# Patient Record
Sex: Female | Born: 1955 | Race: White | Hispanic: No | Marital: Married | Smoking: Never smoker
Health system: Southern US, Community
[De-identification: ages and names within clinical notes are randomized; demographics above are authoritative.]

## PROBLEM LIST (undated history)

## (undated) DIAGNOSIS — H8109 Meniere's disease, unspecified ear: Secondary | ICD-10-CM

---

## 1995-05-30 HISTORY — PX: BREAST EXCISIONAL BIOPSY: SUR124

## 2005-05-15 ENCOUNTER — Ambulatory Visit: Payer: Self-pay | Admitting: Family Medicine

## 2006-10-17 ENCOUNTER — Ambulatory Visit: Payer: Self-pay | Admitting: Family Medicine

## 2007-10-01 ENCOUNTER — Ambulatory Visit: Payer: Self-pay | Admitting: Unknown Physician Specialty

## 2011-07-08 ENCOUNTER — Emergency Department: Payer: Self-pay | Admitting: Emergency Medicine

## 2011-07-08 LAB — COMPREHENSIVE METABOLIC PANEL
Albumin: 4.1 g/dL (ref 3.4–5.0)
Anion Gap: 10 (ref 7–16)
Bilirubin,Total: 0.6 mg/dL (ref 0.2–1.0)
Chloride: 106 mmol/L (ref 98–107)
Co2: 28 mmol/L (ref 21–32)
Creatinine: 0.89 mg/dL (ref 0.60–1.30)
EGFR (African American): >60
EGFR (Non-African Amer.): >60
Glucose: 107 mg/dL — ABNORMAL HIGH (ref 65–99)
Osmolality: 288 (ref 275–301)
Potassium: 3.9 mmol/L (ref 3.5–5.1)
SGPT (ALT): 50 U/L

## 2011-07-08 LAB — URINALYSIS, COMPLETE
Blood: NEGATIVE
Glucose,UR: NEGATIVE mg/dL (ref 0–75)
Nitrite: NEGATIVE
Protein: NEGATIVE
Specific Gravity: 1.019 (ref 1.003–1.030)
WBC UR: 4 /HPF (ref 0–5)

## 2011-07-08 LAB — CBC
HGB: 14 g/dL (ref 12.0–16.0)
MCH: 30.4 pg (ref 26.0–34.0)
MCHC: 34.1 g/dL (ref 32.0–36.0)
MCV: 89 fL (ref 80–100)
RBC: 4.61 10*6/uL (ref 3.80–5.20)

## 2011-07-09 LAB — URINE CULTURE

## 2012-11-20 ENCOUNTER — Ambulatory Visit: Payer: Self-pay | Admitting: Family Medicine

## 2012-12-02 ENCOUNTER — Ambulatory Visit: Payer: Self-pay | Admitting: Family Medicine

## 2013-01-03 ENCOUNTER — Ambulatory Visit: Payer: Self-pay | Admitting: Gastroenterology

## 2014-09-30 ENCOUNTER — Other Ambulatory Visit: Payer: Self-pay | Admitting: Family Medicine

## 2014-09-30 DIAGNOSIS — Z1231 Encounter for screening mammogram for malignant neoplasm of breast: Secondary | ICD-10-CM

## 2014-10-20 ENCOUNTER — Ambulatory Visit
Admission: RE | Admit: 2014-10-20 | Discharge: 2014-10-20 | Disposition: A | Discharge status: Home / Self Care | Payer: 59 | Source: Ambulatory Visit | Attending: Family Medicine | Admitting: Family Medicine

## 2014-10-20 DIAGNOSIS — Z1231 Encounter for screening mammogram for malignant neoplasm of breast: Secondary | ICD-10-CM | POA: Insufficient documentation

## 2015-08-30 ENCOUNTER — Emergency Department
Admission: EM | Admit: 2015-08-30 | Discharge: 2015-08-30 | Disposition: A | Discharge status: Home / Self Care | Payer: 59 | Attending: Emergency Medicine | Admitting: Emergency Medicine

## 2015-08-30 ENCOUNTER — Emergency Department: Payer: 59

## 2015-08-30 DIAGNOSIS — R112 Nausea with vomiting, unspecified: Secondary | ICD-10-CM | POA: Diagnosis present

## 2015-08-30 DIAGNOSIS — E876 Hypokalemia: Secondary | ICD-10-CM | POA: Diagnosis not present

## 2015-08-30 DIAGNOSIS — E860 Dehydration: Secondary | ICD-10-CM | POA: Insufficient documentation

## 2015-08-30 DIAGNOSIS — J209 Acute bronchitis, unspecified: Secondary | ICD-10-CM | POA: Insufficient documentation

## 2015-08-30 HISTORY — DX: Meniere's disease, unspecified ear: H81.09

## 2015-08-30 LAB — COMPREHENSIVE METABOLIC PANEL
ALBUMIN: 4.3 g/dL (ref 3.5–5.0)
ALT: 22 U/L (ref 14–54)
ANION GAP: 13 (ref 5–15)
AST: 26 U/L (ref 15–41)
Alkaline Phosphatase: 118 U/L (ref 38–126)
BUN: 17 mg/dL (ref 6–20)
CHLORIDE: 98 mmol/L — AB (ref 101–111)
CO2: 23 mmol/L (ref 22–32)
Calcium: 9.2 mg/dL (ref 8.9–10.3)
Creatinine, Ser: 0.86 mg/dL (ref 0.44–1.00)
GFR calc non Af Amer: >60 mL/min (ref >60)
GLUCOSE: 126 mg/dL — AB (ref 65–99)
POTASSIUM: 2.6 mmol/L — AB (ref 3.5–5.1)
SODIUM: 134 mmol/L — AB (ref 135–145)
Total Bilirubin: 1.1 mg/dL (ref 0.3–1.2)
Total Protein: 7.6 g/dL (ref 6.5–8.1)

## 2015-08-30 LAB — CBC
HEMATOCRIT: 47.4 % — AB (ref 35.0–47.0)
HEMOGLOBIN: 15.9 g/dL (ref 12.0–16.0)
MCH: 29.2 pg (ref 26.0–34.0)
MCHC: 33.6 g/dL (ref 32.0–36.0)
MCV: 86.7 fL (ref 80.0–100.0)
Platelets: 286 10*3/uL (ref 150–440)
RBC: 5.47 MIL/uL — AB (ref 3.80–5.20)
RDW: 13.7 % (ref 11.5–14.5)
WBC: 11.3 10*3/uL — ABNORMAL HIGH (ref 3.6–11.0)

## 2015-08-30 LAB — URINALYSIS COMPLETE WITH MICROSCOPIC (ARMC ONLY)
BACTERIA UA: NONE SEEN
Bilirubin Urine: NEGATIVE
Glucose, UA: NEGATIVE mg/dL
Hgb urine dipstick: NEGATIVE
KETONES UR: NEGATIVE mg/dL
Nitrite: NEGATIVE
PH: 7 (ref 5.0–8.0)
PROTEIN: NEGATIVE mg/dL
RBC / HPF: NONE SEEN RBC/hpf (ref 0–5)
Specific Gravity, Urine: 1.014 (ref 1.005–1.030)

## 2015-08-30 LAB — LIPASE, BLOOD: LIPASE: 23 U/L (ref 11–51)

## 2015-08-30 MED ORDER — ALBUTEROL SULFATE HFA 108 (90 BASE) MCG/ACT IN AERS: 2.0000 | INHALATION_SPRAY | RESPIRATORY_TRACT | Status: AC | PRN

## 2015-08-30 MED ORDER — ONDANSETRON 4 MG PO TBDP: 4.0000 mg | ORAL_TABLET | Freq: Three times a day (TID) | ORAL | Status: AC | PRN

## 2015-08-30 MED ORDER — ONDANSETRON 4 MG PO TBDP
4.0000 mg | ORAL_TABLET | Freq: Once | ORAL | Status: AC | PRN
  Administered 2015-08-30: 4 mg via ORAL
  Filled 2015-08-30: qty 1

## 2015-08-30 MED ORDER — POTASSIUM CHLORIDE CRYS ER 20 MEQ PO TBCR
EXTENDED_RELEASE_TABLET | ORAL | Status: AC
  Filled 2015-08-30: qty 3

## 2015-08-30 MED ORDER — METHYLPREDNISOLONE SODIUM SUCC 125 MG IJ SOLR
125.0000 mg | INTRAMUSCULAR | Status: AC
  Administered 2015-08-30: 125 mg via INTRAVENOUS

## 2015-08-30 MED ORDER — POTASSIUM CHLORIDE CRYS ER 20 MEQ PO TBCR
60.0000 meq | EXTENDED_RELEASE_TABLET | Freq: Once | ORAL | Status: AC
  Administered 2015-08-30: 60 meq via ORAL
  Filled 2015-08-30: qty 3

## 2015-08-30 MED ORDER — ALBUTEROL SULFATE (2.5 MG/3ML) 0.083% IN NEBU
5.0000 mg | INHALATION_SOLUTION | Freq: Once | RESPIRATORY_TRACT | Status: AC
  Administered 2015-08-30: 5 mg via RESPIRATORY_TRACT

## 2015-08-30 MED ORDER — IPRATROPIUM-ALBUTEROL 0.5-2.5 (3) MG/3ML IN SOLN
3.0000 mL | Freq: Once | RESPIRATORY_TRACT | Status: AC
  Administered 2015-08-30: 3 mL via RESPIRATORY_TRACT

## 2015-08-30 MED ORDER — DEXTROSE 5 % AND 0.9 % NACL IV BOLUS
1000.0000 mL | Freq: Once | INTRAVENOUS | Status: AC
  Administered 2015-08-30: 1000 mL via INTRAVENOUS
  Filled 2015-08-30: qty 1000

## 2015-08-30 MED ORDER — IPRATROPIUM-ALBUTEROL 0.5-2.5 (3) MG/3ML IN SOLN
RESPIRATORY_TRACT | Status: AC
  Filled 2015-08-30: qty 3

## 2015-08-30 MED ORDER — PREDNISONE 20 MG PO TABS: 40.0000 mg | ORAL_TABLET | Freq: Every day | ORAL | Status: AC

## 2015-08-30 MED ORDER — ALBUTEROL SULFATE (2.5 MG/3ML) 0.083% IN NEBU
INHALATION_SOLUTION | RESPIRATORY_TRACT | Status: AC
  Filled 2015-08-30: qty 3

## 2015-08-30 MED ORDER — METHYLPREDNISOLONE SODIUM SUCC 125 MG IJ SOLR
INTRAMUSCULAR | Status: AC
  Filled 2015-08-30: qty 2

## 2015-08-30 MED ORDER — ONDANSETRON HCL 4 MG/2ML IJ SOLN
4.0000 mg | Freq: Once | INTRAMUSCULAR | Status: AC
  Administered 2015-08-30: 4 mg via INTRAVENOUS
  Filled 2015-08-30: qty 2

## 2015-08-30 NOTE — ED Provider Notes (Signed)
[facility] Emergency Department Provider Note  ____________________________________________  Time seen: 3:45 PM  I have reviewed the triage vital signs and the nursing notes.   HISTORY  Chief Complaint Dehydration and Nausea    HPI Taylor Bradley is a 60 y.o. female who complains of nausea vomiting and diarrhea for the past 9 days. She initially started with some nausea and vomiting and thought to have an ear infection and started on amoxicillin by primary care she is been on this for one week. However in the intervening time she's developed diarrhea which she thinks is due to the amoxicillin as well as worsening nausea and fatigue. She has some occasional dizziness but thinks this is baseline due to her Mnire's disease. No syncope or falls. No chest pain. Does feel short of breath and is having a productive cough. No pleuritic pain, no exertional symptoms.     Past Medical History  Diagnosis Date  . Meniere disease      There are no active problems to display for this patient.    Past Surgical History  Procedure Laterality Date  . Breast biopsy Left 1997    negative     Current Outpatient Rx  Name  Route  Sig  Dispense  Refill  . albuterol (PROVENTIL HFA) 108 (90 Base) MCG/ACT inhaler   Inhalation   Inhale 2 puffs into the lungs every 4 (four) hours as needed for wheezing or shortness of breath.   1 Inhaler   0   . ondansetron (ZOFRAN ODT) 4 MG disintegrating tablet   Oral   Take 1 tablet (4 mg total) by mouth every 8 (eight) hours as needed for nausea or vomiting.   20 tablet   0   . predniSONE (DELTASONE) 20 MG tablet   Oral   Take 2 tablets (40 mg total) by mouth daily.   8 tablet   0      Allergies Eggs or egg-derived products and Lactose intolerance (gi)   No family history on file.  Social History Social History  Substance Use Topics  . Smoking status: Never Smoker   . Smokeless tobacco: None  . Alcohol Use:  No    Review of Systems  Constitutional:   No fever or chills. No weight changes. Positive generalized weakness Eyes:   No vision changes.  ENT:   No sore throat. No rhinorrhea. Cardiovascular:   No chest pain. Respiratory:   Positive shortness of breath and productive cough. Gastrointestinal:   Negative for abdominal pain, positive vomiting and diarrhea.  No BRBPR or melena. Genitourinary:   Negative for dysuria or difficulty urinating. Musculoskeletal:   Negative for focal pain or swelling Skin:   Negative for rash. Neurological:   Negative for headaches, focal weakness or numbness.  10-point ROS otherwise negative.  ____________________________________________   PHYSICAL EXAM:  VITAL SIGNS: ED Triage Vitals  Enc Vitals Group     BP 08/30/15 1233 129/84 mmHg     Pulse Rate 08/30/15 1233 99     Resp 08/30/15 1233 18     Temp 08/30/15 1233 97.6 F (36.4 C)     Temp Source 08/30/15 1233 Oral     SpO2 08/30/15 1233 100 %     Weight 08/30/15 1233 163 lb (73.936 kg)     Height 08/30/15 1233  (1.676 m)     Head Cir --      Peak Flow --      Pain Score --  Pain Loc --      Pain Edu? --      Excl. in GC? --     Vital signs reviewed, nursing assessments reviewed.   Constitutional:   Alert and oriented. Well appearing and in no distress. Eyes:   No scleral icterus. No conjunctival pallor. PERRL. EOMI ENT   Head:   Normocephalic and atraumatic.   Nose:   No congestion/rhinnorhea. No septal hematoma   Mouth/Throat:   MMM, positive pharyngeal erythema. No peritonsillar mass.    Neck:   No stridor. No SubQ emphysema. No meningismus. Hematological/Lymphatic/Immunilogical:   No cervical lymphadenopathy. Cardiovascular:   RRR. Symmetric bilateral radial and DP pulses.  No murmurs.  Respiratory:   Normal respiratory effort without tachypnea nor retractions. Good air entry in all lung fields without rales or rhonchi. Diffuse expiratory wheezing. Normal  expiratory phase. Gastrointestinal:   Soft and nontender. Non distended. There is no CVA tenderness.  No rebound, rigidity, or guarding. Genitourinary:   deferred Musculoskeletal:   Nontender with normal range of motion in all extremities. No joint effusions.  No lower extremity tenderness.  No edema. Neurologic:   Normal speech and language.  CN 2-10 normal. Motor grossly intact. No gross focal neurologic deficits are appreciated.  Skin:    Skin is warm, dry and intact. No rash noted.  No petechiae, purpura, or bullae. Psychiatric:   Mood and affect are normal. ____________________________________________    LABS (pertinent positives/negatives) (all labs ordered are listed, but only abnormal results are displayed) Labs Reviewed  COMPREHENSIVE METABOLIC PANEL - Abnormal; Notable for the following:    Sodium 134 (*)    Potassium 2.6 (*)    Chloride 98 (*)    Glucose, Bld 126 (*)    All other components within normal limits  CBC - Abnormal; Notable for the following:    WBC 11.3 (*)    RBC 5.47 (*)    HCT 47.4 (*)    All other components within normal limits  URINALYSIS COMPLETEWITH MICROSCOPIC ([facility] ONLY) - Abnormal; Notable for the following:    Color, Urine YELLOW (*)    APPearance CLOUDY (*)    Leukocytes, UA 1+ (*)    Squamous Epithelial / LPF 0-5 (*)    All other components within normal limits  LIPASE, BLOOD   ____________________________________________   EKG    ____________________________________________    RADIOLOGY  Chest x-ray unremarkable  ____________________________________________   PROCEDURES   ____________________________________________   INITIAL IMPRESSION / ASSESSMENT AND PLAN / ED COURSE  Pertinent labs & imaging results that were available during my care of the patient were reviewed by me and considered in my medical decision making (see chart for details).  Patient sent to ED by PCP for evaluation for dehydration. Does appear to  be mildly dehydrated, but she is also tolerating oral intake and drinking and or soda on arrival. More impressive actually is that she is very wheezy and coughing which appears to be an acute bronchitis in the setting of what is likely to be a viral illness. Chest x-ray unremarkable, afebrile, white blood cell come unremarkable, no focal findings on exam. No antibiotics indicated at this time. Patient treated with nebulizer treatments and prednisone and does feel better. Repeat auscultation at 6:40 PM shows persistent wheezing but much improved. We'll continue her on these therapies and have her follow up with primary care in 2-3 days for acute assessment. Low suspicion for ACS PE TAD pneumothorax carditis mediastinitis pneumonia or sepsis. Abdomen is benign  on exam. Lab workup is significant only for hypokalemia which was repleted with oral potassium in the emergency department and again the patient is tolerating oral intake. We'll discharge home with prednisone and albuterol and Zofran.     ____________________________________________   FINAL CLINICAL IMPRESSION(S) / ED DIAGNOSES  Final diagnoses:  Acute bronchitis, unspecified organism  Hypokalemia  Dehydration      Sharman CheekPhillip Olamae Ferrara, MD 08/30/15 [phone]

## 2015-08-30 NOTE — ED Notes (Signed)
Pt sent by dr Burnett Shenghedrick today for eval of dehydration.  Pt has diarrhea, cough and intermittent fever/chills.  Sx for 1 week.  Pt states negative flu swab.  Pt alert.  Family at bedside.

## 2015-08-30 NOTE — ED Notes (Signed)
Pt arrives to ER via POV from home. PT states that her PCP advised her to come to ER for evaluation due to dehydration. Pt NVD since last Saturday. Pt alert and oriented X4, active, cooperative, pt in NAD. RR even and unlabored, color WNL.

## 2015-08-30 NOTE — Discharge Instructions (Signed)
Acute Bronchitis Bronchitis is inflammation of the airways that extend from the windpipe into the lungs (bronchi). The inflammation often causes mucus to develop. This leads to a cough, which is the most common symptom of bronchitis.  In acute bronchitis, the condition usually develops suddenly and goes away over time, usually in a couple weeks. Smoking, allergies, and asthma can make bronchitis worse. Repeated episodes of bronchitis may cause further lung problems.  CAUSES Acute bronchitis is most often caused by the same virus that causes a cold. The virus can spread from person to person (contagious) through coughing, sneezing, and touching contaminated objects. SIGNS AND SYMPTOMS   Cough.   Fever.   Coughing up mucus.   Body aches.   Chest congestion.   Chills.   Shortness of breath.   Sore throat.  DIAGNOSIS  Acute bronchitis is usually diagnosed through a physical exam. Your health care provider will also ask you questions about your medical history. Tests, such as chest X-rays, are sometimes done to rule out other conditions.  TREATMENT  Acute bronchitis usually goes away in a couple weeks. Oftentimes, no medical treatment is necessary. Medicines are sometimes given for relief of fever or cough. Antibiotic medicines are usually not needed but may be prescribed in certain situations. In some cases, an inhaler may be recommended to help reduce shortness of breath and control the cough. A cool mist vaporizer may also be used to help thin bronchial secretions and make it easier to clear the chest.  HOME CARE INSTRUCTIONS  Get plenty of rest.   Drink enough fluids to keep your urine clear or pale yellow (unless you have a medical condition that requires fluid restriction). Increasing fluids may help thin your respiratory secretions (sputum) and reduce chest congestion, and it will prevent dehydration.   Take medicines only as directed by your health care provider.  If  you were prescribed an antibiotic medicine, finish it all even if you start to feel better.  Avoid smoking and secondhand smoke. Exposure to cigarette smoke or irritating chemicals will make bronchitis worse. If you are a smoker, consider using nicotine gum or skin patches to help control withdrawal symptoms. Quitting smoking will help your lungs heal faster.   Reduce the chances of another bout of acute bronchitis by washing your hands frequently, avoiding people with cold symptoms, and trying not to touch your hands to your mouth, nose, or eyes.   Keep all follow-up visits as directed by your health care provider.  SEEK MEDICAL CARE IF: Your symptoms do not improve after 1 week of treatment.  SEEK IMMEDIATE MEDICAL CARE IF:  You develop an increased fever or chills.   You have chest pain.   You have severe shortness of breath.  You have bloody sputum.   You develop dehydration.  You faint or repeatedly feel like you are going to pass out.  You develop repeated vomiting.  You develop a severe headache. MAKE SURE YOU:   Understand these instructions.  Will watch your condition.  Will get help right away if you are not doing well or get worse.   This information is not intended to replace advice given to you by your health care provider. Make sure you discuss any questions you have with your health care provider.   Document Released: 06/22/2004 Document Revised: 06/05/2014 Document Reviewed: 11/05/2012 Elsevier Interactive Patient Education 2016 [employer]  Dehydration, Adult Dehydration is a condition in which you do not have enough fluid or water in your  body. It happens when you take in less fluid than you lose. Vital organs such as the kidneys, brain, and heart cannot function without a proper amount of fluids. Any loss of fluids from the body can cause dehydration.  Dehydration can range from mild to severe. This condition should be treated right away to help  prevent it from becoming severe. CAUSES  This condition may be caused by:  Vomiting.  Diarrhea.  Excessive sweating, such as when exercising in hot or humid weather.  Not drinking enough fluid during strenuous exercise or during an illness.  Excessive urine output.  Fever.  Certain medicines. RISK FACTORS This condition is more likely to develop in:  People who are taking certain medicines that cause the body to lose excess fluid (diuretics).   People who have a chronic illness, such as diabetes, that may increase urination.  Older adults.   People who live at high altitudes.   People who participate in endurance sports.  SYMPTOMS  Mild Dehydration  Thirst.  Dry lips.  Slightly dry mouth.  Dry, warm skin. Moderate Dehydration  Very dry mouth.   Muscle cramps.   Dark urine and decreased urine production.   Decreased tear production.   Headache.   Light-headedness, especially when you stand up from a sitting position.  Severe Dehydration  Changes in skin.   Cold and clammy skin.   Skin does not spring back quickly when lightly pinched and released.   Changes in body fluids.   Extreme thirst.   No tears.   Not able to sweat when body temperature is high, such as in hot weather.   Minimal urine production.   Changes in vital signs.   Rapid, weak pulse (more than 100 beats per minute when you are sitting still).   Rapid breathing.   Low blood pressure.   Other changes.   Sunken eyes.   Cold hands and feet.   Confusion.  Lethargy and difficulty being awakened.  Fainting (syncope).   Short-term weight loss.   Unconsciousness. DIAGNOSIS  This condition may be diagnosed based on your symptoms. You may also have tests to determine how severe your dehydration is. These tests may include:   Urine tests.   Blood tests.  TREATMENT  Treatment for this condition depends on the severity. Mild or moderate  dehydration can often be treated at home. Treatment should be started right away. Do not wait until dehydration becomes severe. Severe dehydration needs to be treated at the hospital. Treatment for Mild Dehydration  Drinking plenty of water to replace the fluid you have lost.   Replacing minerals in your blood (electrolytes) that you may have lost.  Treatment for Moderate Dehydration  Consuming oral rehydration solution (ORS). Treatment for Severe Dehydration  Receiving fluid through an IV tube.   Receiving electrolyte solution through a feeding tube that is passed through your nose and into your stomach (nasogastric tube or NG tube).  Correcting any abnormalities in electrolytes. HOME CARE INSTRUCTIONS   Drink enough fluid to keep your urine clear or pale yellow.   Drink water or fluid slowly by taking small sips. You can also try sucking on ice cubes.  Have food or beverages that contain electrolytes. Examples include bananas and sports drinks.  Take over-the-counter and prescription medicines only as told by your health care provider.   Prepare ORS according to the manufacturer's instructions. Take sips of ORS every 5 minutes until your urine returns to normal.  If you have vomiting or  diarrhea, continue to try to drink water, ORS, or both.   If you have diarrhea, avoid:   Beverages that contain caffeine.   Fruit juice.   Milk.   Carbonated soft drinks.  Do not take salt tablets. This can lead to the condition of having too much sodium in your body (hypernatremia).  SEEK MEDICAL CARE IF:  You cannot eat or drink without vomiting.  You have had moderate diarrhea during a period of more than 24 hours.  You have a fever. SEEK IMMEDIATE MEDICAL CARE IF:   You have extreme thirst.  You have severe diarrhea.  You have not urinated in 6-8 hours, or you have urinated only a small amount of very dark urine.  You have shriveled skin.  You are dizzy,  confused, or both.   This information is not intended to replace advice given to you by your health care provider. Make sure you discuss any questions you have with your health care provider.   Document Released: 05/15/2005 Document Revised: 02/03/2015 Document Reviewed: 09/30/2014 Elsevier Interactive Patient Education 2016 [employer].  Hypokalemia Hypokalemia means that the amount of potassium in the blood is lower than normal.Potassium is a chemical, called an electrolyte, that helps regulate the amount of fluid in the body. It also stimulates muscle contraction and helps nerves function properly.Most of the body's potassium is inside of cells, and only a very small amount is in the blood. Because the amount in the blood is so small, minor changes can be life-threatening. CAUSES  Antibiotics.  Diarrhea or vomiting.  Using laxatives too much, which can cause diarrhea.  Chronic kidney disease.  Water pills (diuretics).  Eating disorders (bulimia).  Low magnesium level.  Sweating a lot. SIGNS AND SYMPTOMS  Weakness.  Constipation.  Fatigue.  Muscle cramps.  Mental confusion.  Skipped heartbeats or irregular heartbeat (palpitations).  Tingling or numbness. DIAGNOSIS  Your health care provider can diagnose hypokalemia with blood tests. In addition to checking your potassium level, your health care provider may also check other lab tests. TREATMENT Hypokalemia can be treated with potassium supplements taken by mouth or adjustments in your current medicines. If your potassium level is very low, you may need to get potassium through a vein (IV) and be monitored in the hospital. A diet high in potassium is also helpful. Foods high in potassium are:  Nuts, such as peanuts and pistachios.  Seeds, such as sunflower seeds and pumpkin seeds.  Peas, lentils, and lima beans.  Whole grain and bran cereals and breads.  Fresh fruit and vegetables, such as apricots, avocado,  bananas, cantaloupe, kiwi, oranges, tomatoes, asparagus, and potatoes.  Orange and tomato juices.  Red meats.  Fruit yogurt. HOME CARE INSTRUCTIONS  Take all medicines as prescribed by your health care provider.  Maintain a healthy diet by including nutritious food, such as fruits, vegetables, nuts, whole grains, and lean meats.  If you are taking a laxative, be sure to follow the directions on the label. SEEK MEDICAL CARE IF:  Your weakness gets worse.  You feel your heart pounding or racing.  You are vomiting or having diarrhea.  You are diabetic and having trouble keeping your blood glucose in the normal range. SEEK IMMEDIATE MEDICAL CARE IF:  You have chest pain, shortness of breath, or dizziness.  You are vomiting or having diarrhea for more than 2 days.  You faint. MAKE SURE YOU:   Understand these instructions.  Will watch your condition.  Will get help right away  if you are not doing well or get worse.   This information is not intended to replace advice given to you by your health care provider. Make sure you discuss any questions you have with your health care provider.   Document Released: 05/15/2005 Document Revised: 06/05/2014 Document Reviewed: 11/15/2012 Elsevier Interactive Patient Education [employer].

## 2015-08-30 NOTE — ED Notes (Signed)
meds given.  Pt rec breathing treatment now.

## 2016-09-05 ENCOUNTER — Other Ambulatory Visit: Payer: Self-pay | Admitting: Podiatry

## 2016-09-05 DIAGNOSIS — M7661 Achilles tendinitis, right leg: Secondary | ICD-10-CM

## 2016-09-05 DIAGNOSIS — S86011D Strain of right Achilles tendon, subsequent encounter: Secondary | ICD-10-CM

## 2016-09-14 ENCOUNTER — Ambulatory Visit: Payer: 59

## 2017-01-08 ENCOUNTER — Other Ambulatory Visit: Payer: Self-pay | Admitting: Neurology

## 2017-01-08 DIAGNOSIS — R410 Disorientation, unspecified: Secondary | ICD-10-CM

## 2017-01-08 DIAGNOSIS — R413 Other amnesia: Secondary | ICD-10-CM

## 2017-01-12 ENCOUNTER — Ambulatory Visit
Admission: RE | Admit: 2017-01-12 | Discharge: 2017-01-12 | Disposition: A | Discharge status: Home / Self Care | Payer: 59 | Source: Ambulatory Visit | Attending: Neurology | Admitting: Neurology

## 2017-01-12 DIAGNOSIS — R410 Disorientation, unspecified: Secondary | ICD-10-CM | POA: Diagnosis not present

## 2017-01-12 DIAGNOSIS — H938X2 Other specified disorders of left ear: Secondary | ICD-10-CM | POA: Insufficient documentation

## 2017-01-12 DIAGNOSIS — R413 Other amnesia: Secondary | ICD-10-CM | POA: Insufficient documentation

## 2017-01-12 LAB — POCT I-STAT CREATININE: CREATININE: 1.2 mg/dL — AB (ref 0.44–1.00)

## 2017-01-12 MED ORDER — GADOBENATE DIMEGLUMINE 529 MG/ML IV SOLN
15.0000 mL | Freq: Once | INTRAVENOUS | Status: AC | PRN
  Administered 2017-01-12: 15 mL via INTRAVENOUS

## 2017-01-16 ENCOUNTER — Ambulatory Visit: Payer: 59 | Attending: Neurology

## 2017-01-16 DIAGNOSIS — R0683 Snoring: Secondary | ICD-10-CM | POA: Diagnosis present

## 2017-01-16 DIAGNOSIS — G4761 Periodic limb movement disorder: Secondary | ICD-10-CM | POA: Insufficient documentation

## 2018-07-19 ENCOUNTER — Other Ambulatory Visit: Payer: Self-pay | Admitting: Family Medicine

## 2018-07-19 DIAGNOSIS — Z1231 Encounter for screening mammogram for malignant neoplasm of breast: Secondary | ICD-10-CM

## 2018-07-31 ENCOUNTER — Ambulatory Visit
Admission: RE | Admit: 2018-07-31 | Discharge: 2018-07-31 | Disposition: A | Discharge status: Home / Self Care | Payer: 59 | Source: Ambulatory Visit | Attending: Family Medicine | Admitting: Family Medicine

## 2018-07-31 DIAGNOSIS — Z1231 Encounter for screening mammogram for malignant neoplasm of breast: Secondary | ICD-10-CM | POA: Diagnosis present

## 2020-06-16 ENCOUNTER — Other Ambulatory Visit: Payer: Self-pay | Admitting: Family Medicine

## 2020-06-16 DIAGNOSIS — Z1231 Encounter for screening mammogram for malignant neoplasm of breast: Secondary | ICD-10-CM

## 2020-07-14 ENCOUNTER — Ambulatory Visit
Admission: RE | Admit: 2020-07-14 | Discharge: 2020-07-14 | Disposition: A | Discharge status: Home / Self Care | Payer: 59 | Source: Ambulatory Visit | Attending: Family Medicine | Admitting: Family Medicine

## 2020-07-14 ENCOUNTER — Other Ambulatory Visit: Payer: Self-pay

## 2020-07-14 DIAGNOSIS — Z1231 Encounter for screening mammogram for malignant neoplasm of breast: Secondary | ICD-10-CM | POA: Diagnosis present

## 2020-12-02 ENCOUNTER — Other Ambulatory Visit (HOSPITAL_COMMUNITY): Payer: Self-pay | Admitting: Student

## 2020-12-02 ENCOUNTER — Other Ambulatory Visit: Payer: Self-pay | Admitting: Student

## 2020-12-02 DIAGNOSIS — R1012 Left upper quadrant pain: Secondary | ICD-10-CM

## 2020-12-09 ENCOUNTER — Ambulatory Visit: Payer: 59

## 2020-12-14 ENCOUNTER — Ambulatory Visit: Admission: RE | Admit: 2020-12-14 | Payer: Medicare Other | Source: Ambulatory Visit

## 2020-12-29 ENCOUNTER — Other Ambulatory Visit: Payer: Self-pay

## 2020-12-29 ENCOUNTER — Ambulatory Visit
Admission: RE | Admit: 2020-12-29 | Discharge: 2020-12-29 | Disposition: A | Discharge status: Home / Self Care | Payer: Medicare Other | Source: Ambulatory Visit | Attending: Student | Admitting: Student

## 2020-12-29 DIAGNOSIS — R1012 Left upper quadrant pain: Secondary | ICD-10-CM | POA: Diagnosis present

## 2020-12-29 LAB — POCT I-STAT CREATININE: Creatinine, Ser: 1 mg/dL (ref 0.44–1.00)

## 2020-12-29 MED ORDER — IOHEXOL 300 MG/ML  SOLN
100.0000 mL | Freq: Once | INTRAMUSCULAR | Status: AC | PRN
  Administered 2020-12-29: 100 mL via INTRAVENOUS

## 2021-05-16 ENCOUNTER — Other Ambulatory Visit: Payer: Self-pay | Admitting: Family Medicine

## 2021-05-16 DIAGNOSIS — R0781 Pleurodynia: Secondary | ICD-10-CM

## 2021-05-26 ENCOUNTER — Other Ambulatory Visit: Payer: Self-pay

## 2021-05-26 ENCOUNTER — Encounter
Admission: RE | Admit: 2021-05-26 | Discharge: 2021-05-26 | Disposition: A | Discharge status: Home / Self Care | Payer: Medicare Other | Source: Ambulatory Visit | Attending: Family Medicine | Admitting: Family Medicine

## 2021-05-26 DIAGNOSIS — R0781 Pleurodynia: Secondary | ICD-10-CM | POA: Insufficient documentation

## 2021-05-26 MED ORDER — TECHNETIUM TC 99M MEDRONATE IV KIT
20.0000 | PACK | Freq: Once | INTRAVENOUS | Status: AC | PRN
  Administered 2021-05-26: 08:00:00 21.4 via INTRAVENOUS

## 2021-09-06 ENCOUNTER — Other Ambulatory Visit: Payer: Self-pay | Admitting: Family Medicine

## 2021-09-06 DIAGNOSIS — Z1231 Encounter for screening mammogram for malignant neoplasm of breast: Secondary | ICD-10-CM

## 2021-09-29 ENCOUNTER — Ambulatory Visit
Admission: RE | Admit: 2021-09-29 | Discharge: 2021-09-29 | Disposition: A | Discharge status: Home / Self Care | Payer: Medicare Other | Source: Ambulatory Visit | Attending: Family Medicine | Admitting: Family Medicine

## 2021-09-29 DIAGNOSIS — Z1231 Encounter for screening mammogram for malignant neoplasm of breast: Secondary | ICD-10-CM | POA: Diagnosis present

## 2022-06-11 IMAGING — MG MM DIGITAL SCREENING BILAT W/ TOMO AND CAD
8 series · 8 of 24 positions shown · non-contrast
Comparison: Previous exam(s).

CLINICAL DATA: Screening.

EXAM:
DIGITAL SCREENING BILATERAL MAMMOGRAM WITH TOMOSYNTHESIS AND CAD
TECHNIQUE: Bilateral screening digital craniocaudal and mediolateral oblique
mammograms were obtained. Bilateral screening digital breast
tomosynthesis was performed. The images were evaluated with
computer-aided detection.

[L CC synth-2D]
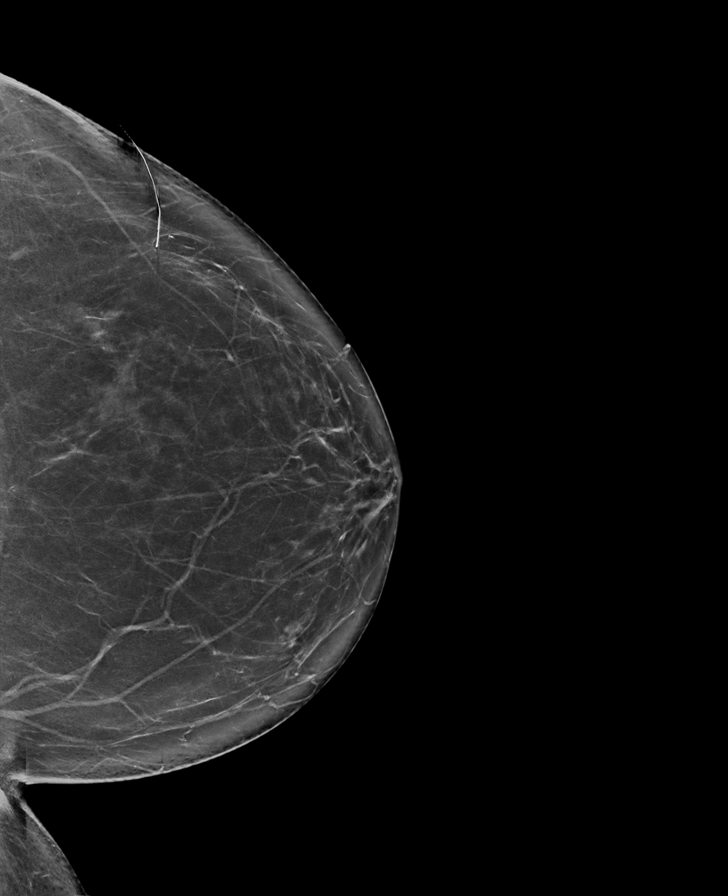

[L MLO synth-2D]
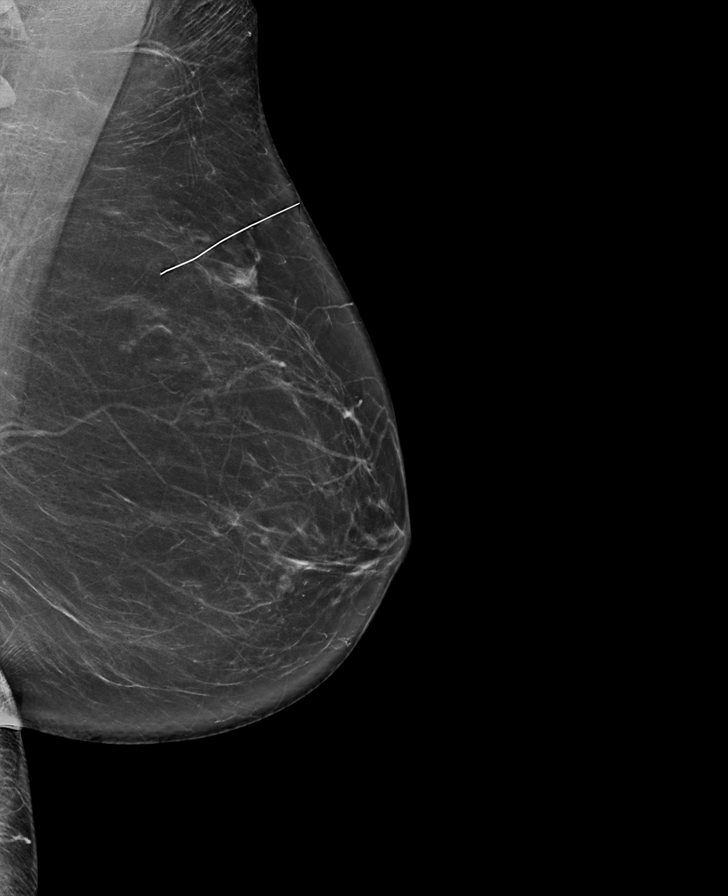

[R CC synth-2D]
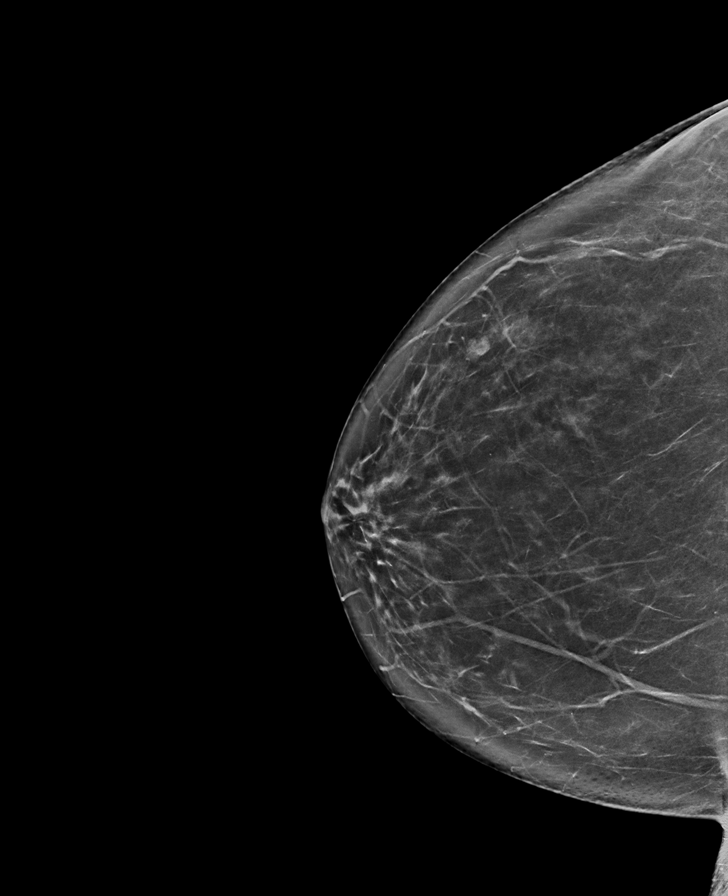

[R MLO synth-2D]
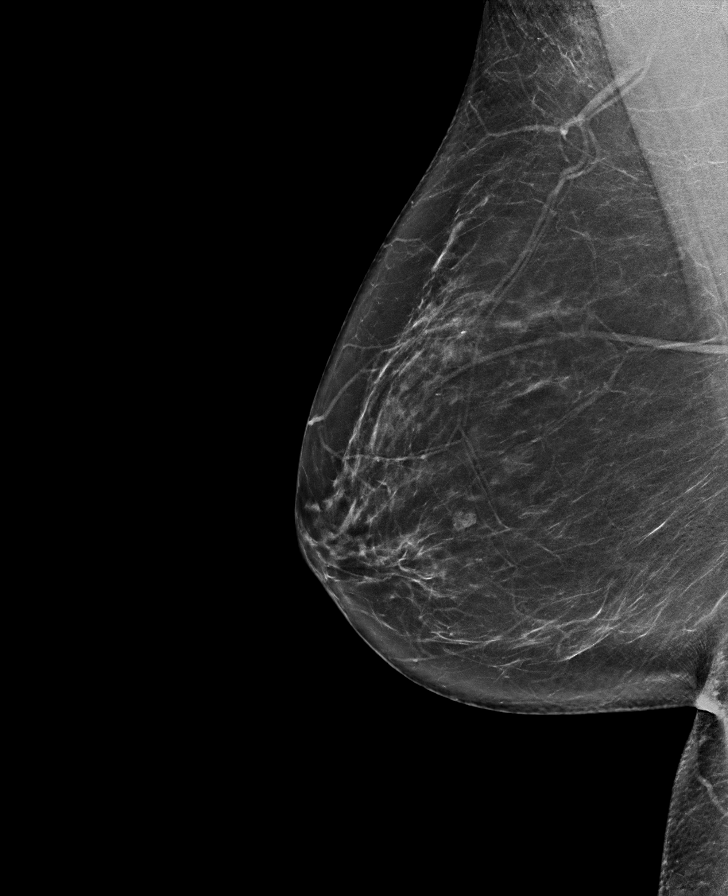

[R CC tomo · tomo slice 39/77.0]
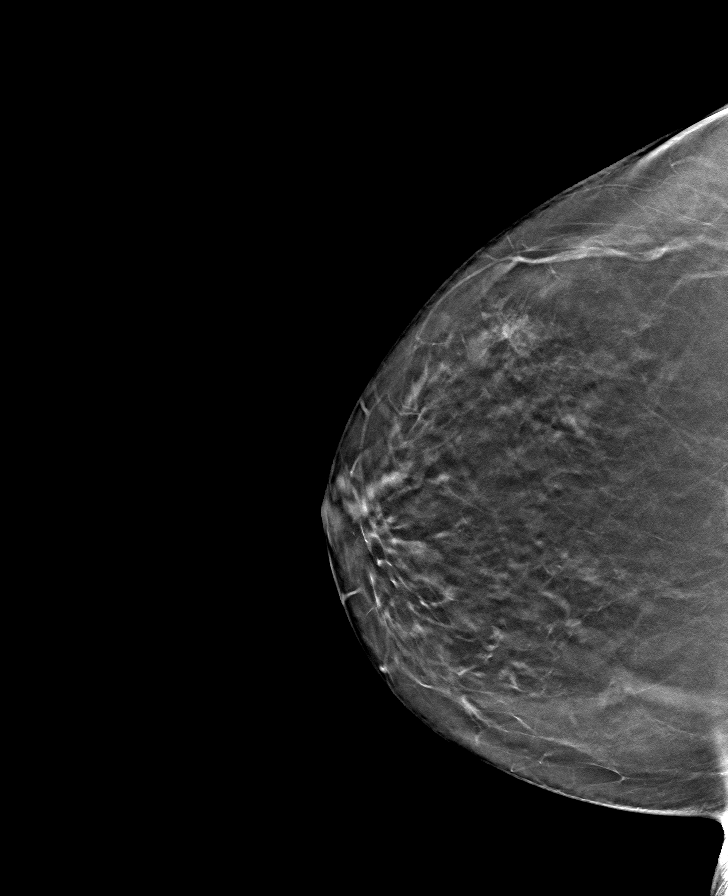

[L CC tomo · tomo slice 39/77.0]
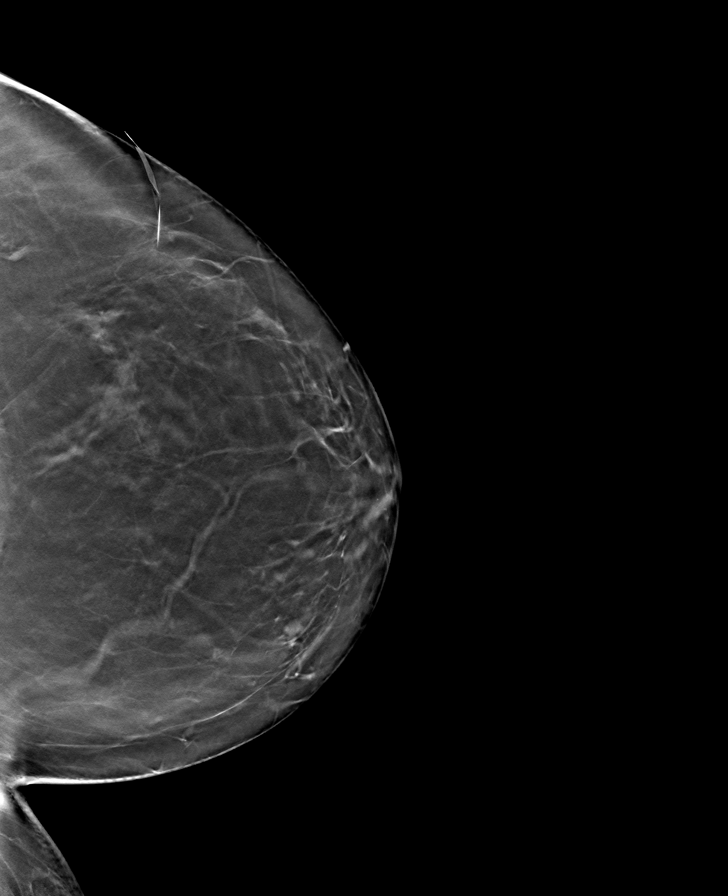

[L MLO tomo · tomo slice 41/81.0]
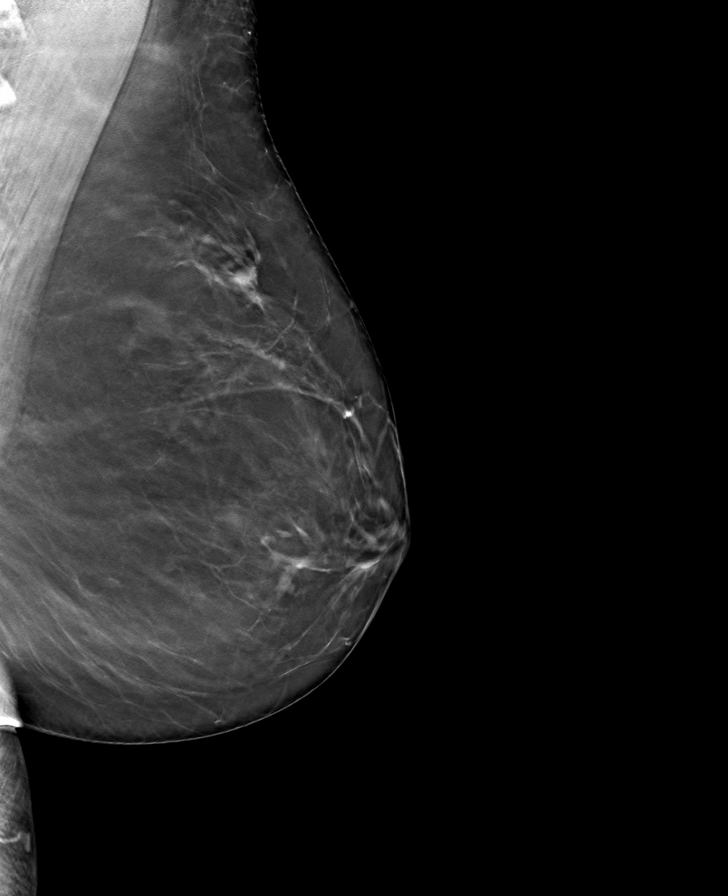

[R MLO tomo · tomo slice 40/79.0]
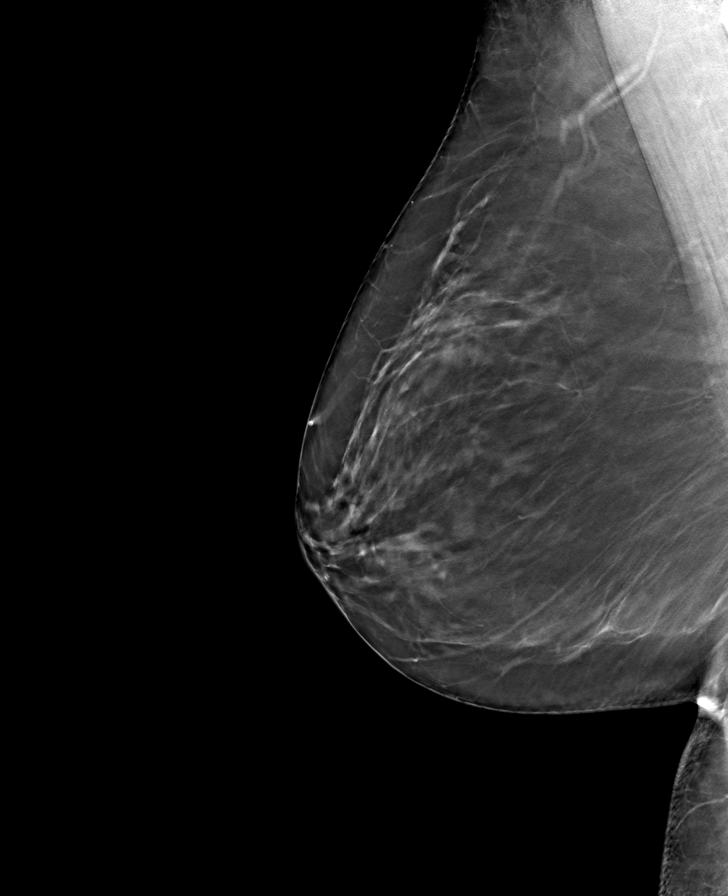

[8 of 24 positions shown; findings below may reference images not displayed]

ACR Breast Density Category b: There are scattered areas of
fibroglandular density.
FINDINGS: There are no findings suspicious for malignancy.
IMPRESSION: No mammographic evidence of malignancy. A result letter of this
screening mammogram will be mailed directly to the patient.

RECOMMENDATION:
Screening mammogram in one year. (Code:51-O-LD2)

BI-RADS CATEGORY  1: Negative.

## 2023-11-06 ENCOUNTER — Other Ambulatory Visit: Payer: Self-pay | Admitting: Family Medicine

## 2023-11-06 DIAGNOSIS — Z1231 Encounter for screening mammogram for malignant neoplasm of breast: Secondary | ICD-10-CM

## 2023-11-22 ENCOUNTER — Ambulatory Visit
Admission: RE | Admit: 2023-11-22 | Discharge: 2023-11-22 | Disposition: A | Discharge status: Home / Self Care | Source: Ambulatory Visit | Attending: Family Medicine | Admitting: Family Medicine

## 2023-11-22 DIAGNOSIS — Z1231 Encounter for screening mammogram for malignant neoplasm of breast: Secondary | ICD-10-CM | POA: Insufficient documentation

## 2024-05-02 ENCOUNTER — Other Ambulatory Visit: Payer: Self-pay | Admitting: Neurology

## 2024-05-02 DIAGNOSIS — G20C Parkinsonism, unspecified: Secondary | ICD-10-CM

## 2024-05-07 ENCOUNTER — Ambulatory Visit: Admission: RE | Admit: 2024-05-07 | Source: Ambulatory Visit

## 2024-05-12 ENCOUNTER — Ambulatory Visit
Admission: RE | Admit: 2024-05-12 | Discharge: 2024-05-12 | Disposition: A | Source: Ambulatory Visit | Attending: Neurology | Admitting: Neurology

## 2024-05-12 DIAGNOSIS — G20C Parkinsonism, unspecified: Secondary | ICD-10-CM | POA: Diagnosis present

## 2024-07-17 ENCOUNTER — Ambulatory Visit: Admit: 2024-07-17 | Admitting: Gastroenterology
